# Patient Record
Sex: Female | Born: 1975 | Race: Black or African American | Hispanic: No | Marital: Married | Smoking: Never smoker
Health system: Southern US, Community
[De-identification: ages and names within clinical notes are randomized; demographics above are authoritative.]

---

## 2007-02-27 ENCOUNTER — Encounter: Admission: RE | Admit: 2007-02-27 | Discharge: 2007-02-28 | Payer: Self-pay | Admitting: Obstetrics and Gynecology

## 2007-03-01 ENCOUNTER — Inpatient Hospital Stay (HOSPITAL_COMMUNITY): Admission: AD | Admit: 2007-03-01 | Discharge: 2007-03-06 | Payer: Self-pay | Admitting: Obstetrics and Gynecology

## 2007-03-03 ENCOUNTER — Encounter (INDEPENDENT_AMBULATORY_CARE_PROVIDER_SITE_OTHER): Payer: Self-pay | Admitting: *Deleted

## 2008-04-04 ENCOUNTER — Ambulatory Visit (HOSPITAL_COMMUNITY): Admission: RE | Admit: 2008-04-04 | Discharge: 2008-04-04 | Payer: Self-pay | Admitting: Obstetrics

## 2008-04-04 IMAGING — US US OB COMP LESS 14 WK
1 series · 14 of 28 positions shown · non-contrast
Comparison: none

OBSTETRICAL ULTRASOUND:
 This ultrasound exam was performed in the [HOSPITAL] Ultrasound Department.  The OB US report was generated in the AS system, and faxed to the ordering physician.  This report is also available in [REDACTED] PACS.

[Series 1: us ob comp less 14 wks · 14 of 50 slices shown]
[im 2/50]
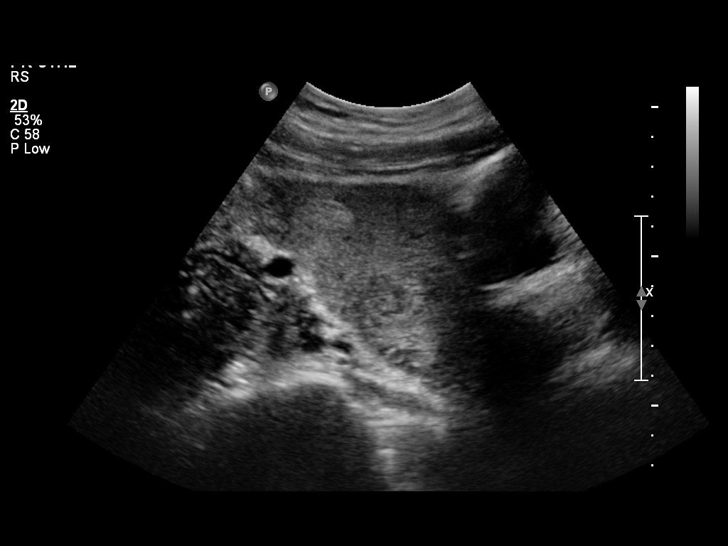
[im 6/50]
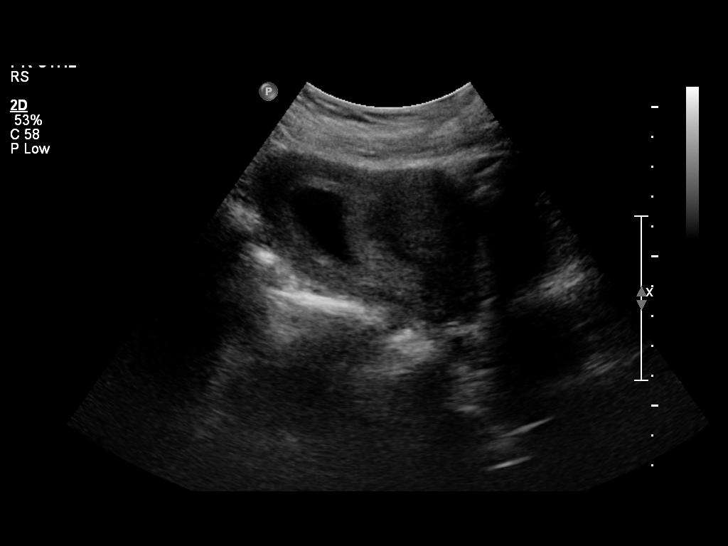
[im 10/50]
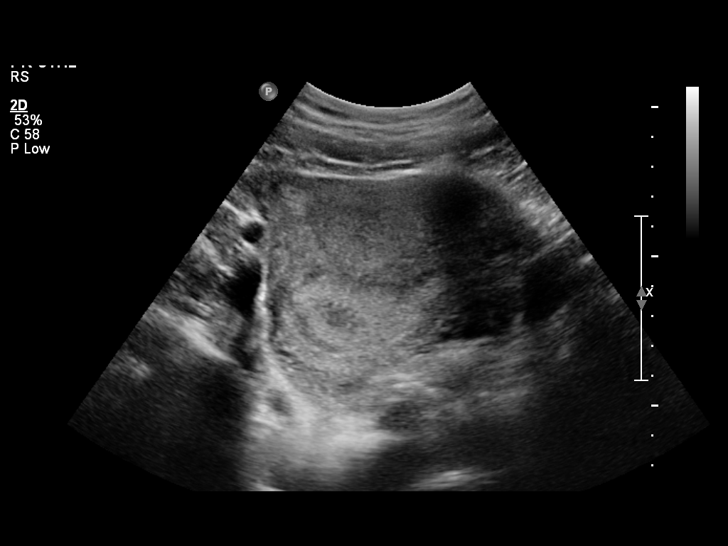
[im 13/50]
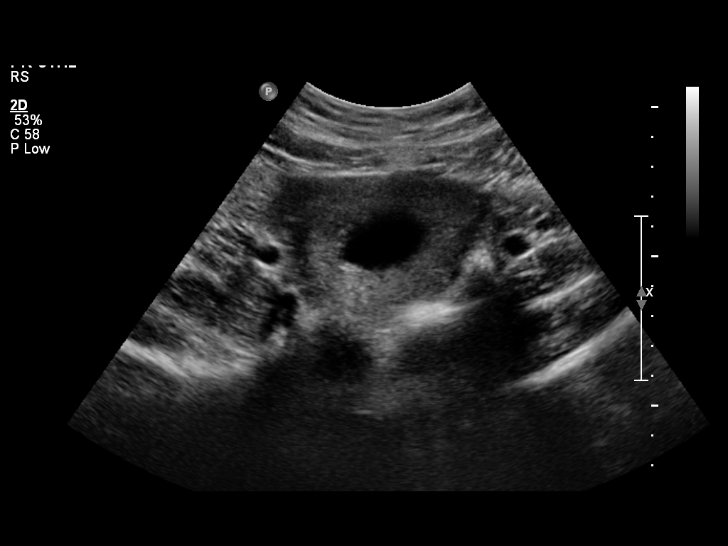
[im 17/50]
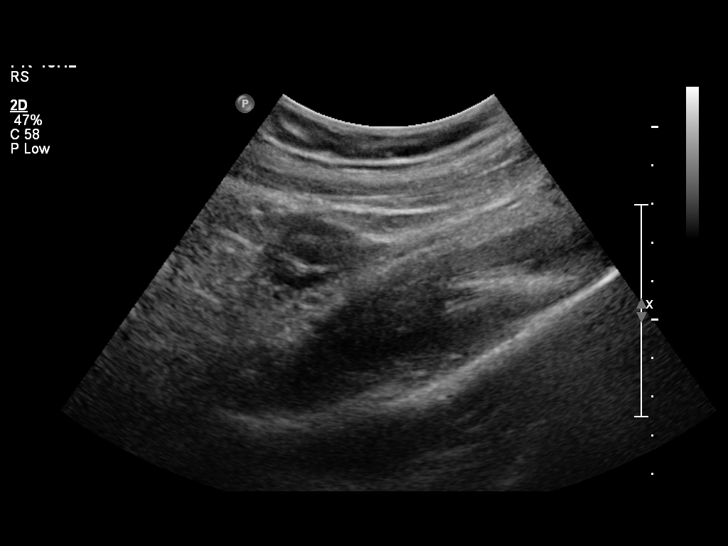
[im 20/50]
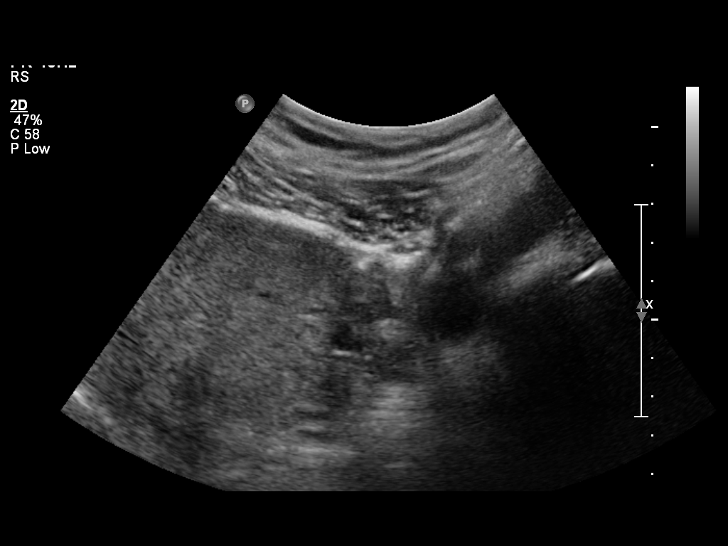
[im 24/50]
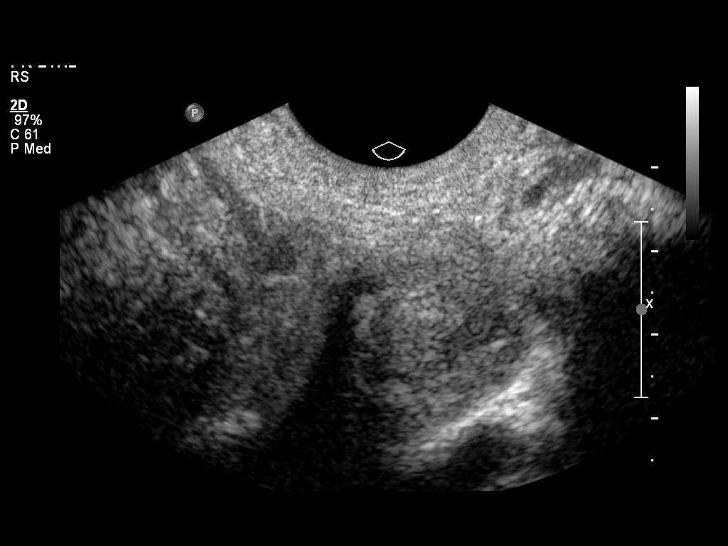
[im 28/50]
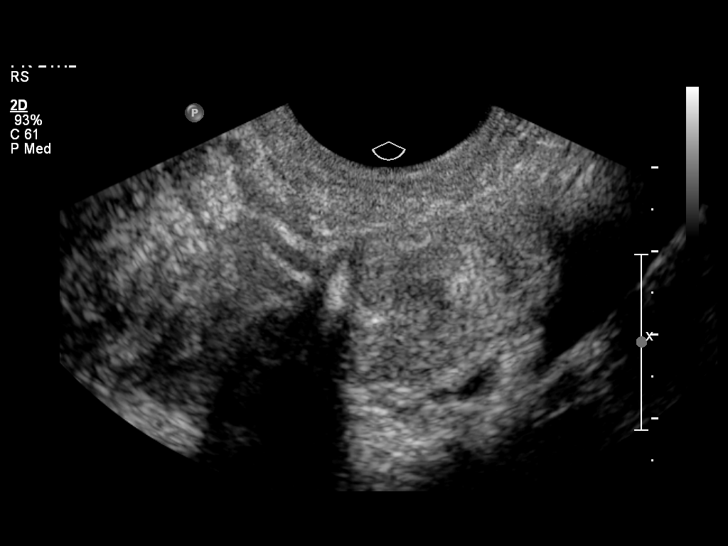
[im 31/50]
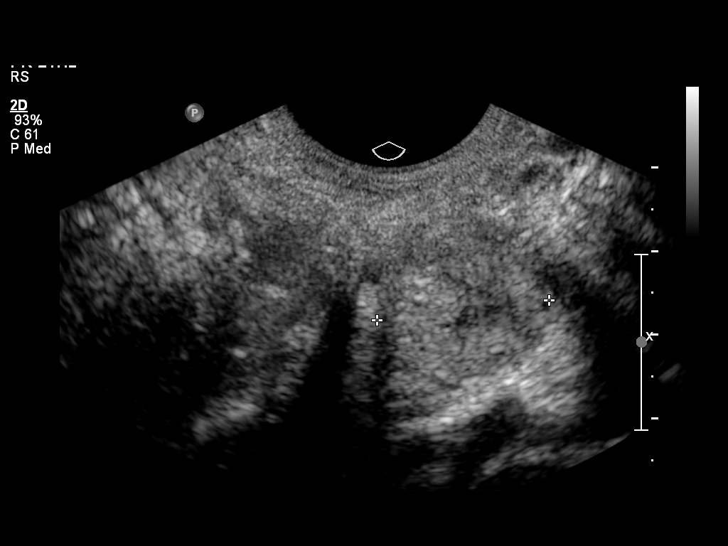
[im 35/50]
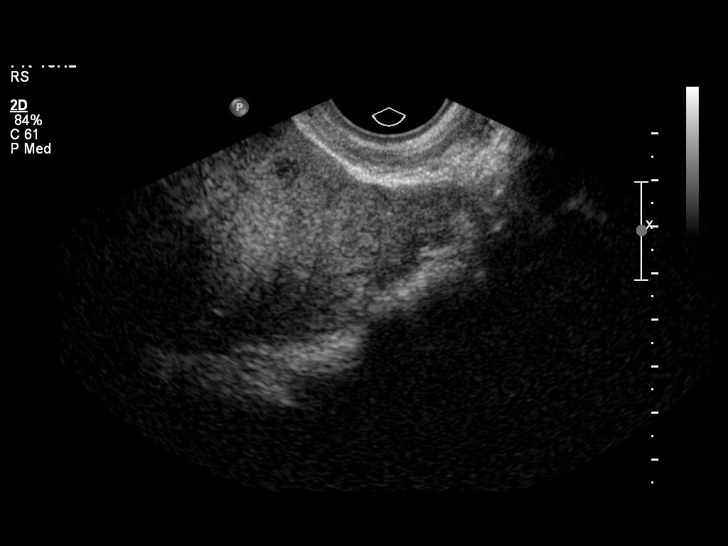
[im 39/50]
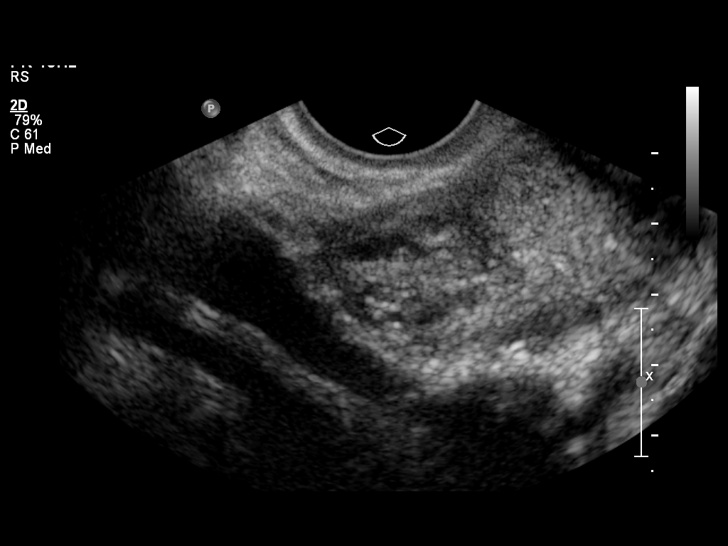
[im 42/50]
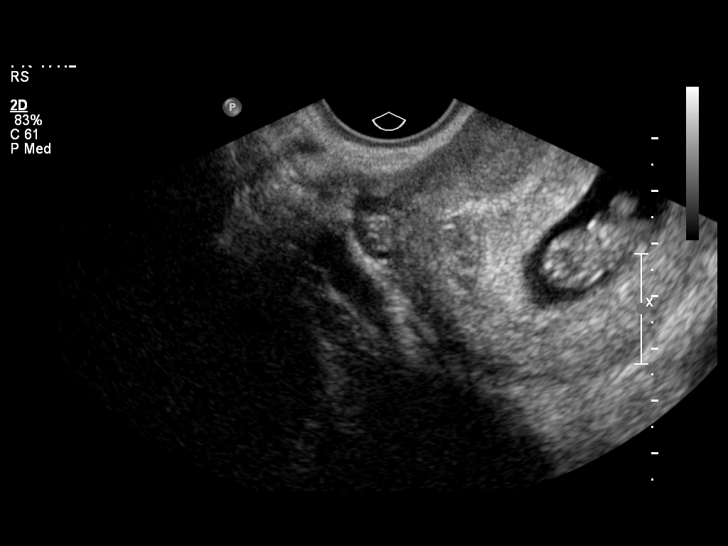
[im 46/50]
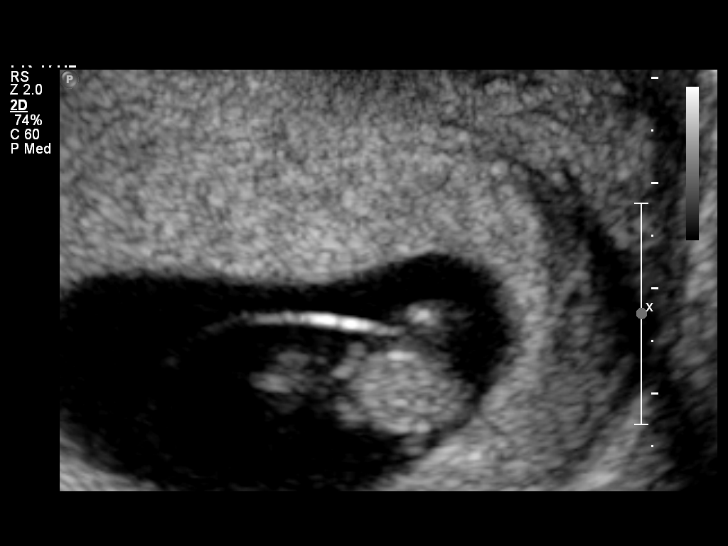
[im 50/50]
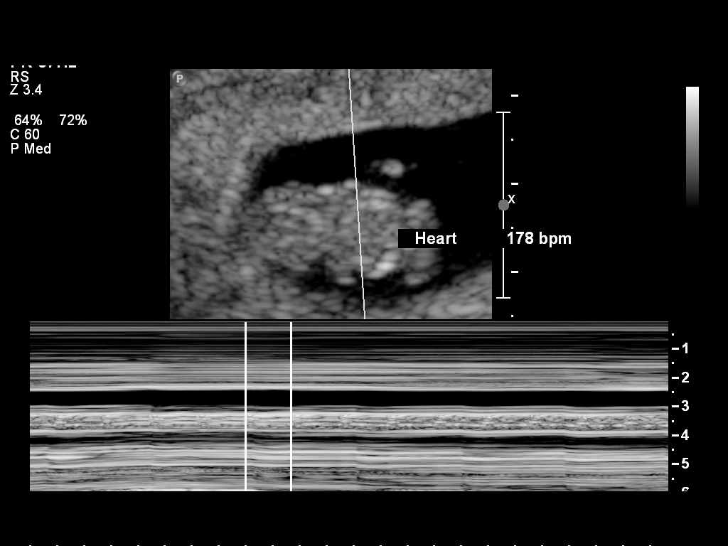

[14 of 28 positions shown; findings below may reference images not displayed]

IMPRESSION: See AS Obstetric US report.

## 2008-10-29 ENCOUNTER — Inpatient Hospital Stay (HOSPITAL_COMMUNITY): Admission: RE | Admit: 2008-10-29 | Discharge: 2008-11-01 | Payer: Self-pay | Admitting: Obstetrics

## 2011-05-04 NOTE — Op Note (Signed)
Gillespie, Cindy NO.:  [id number]   MEDICAL RECORD NO.:  [id number]          PATIENT TYPE:  INP   LOCATION:  9199                          FACILITY:  [facility]   PHYSICIAN:  Kathreen Cosier, M.D.DATE OF BIRTH:  [birth year]   DATE OF PROCEDURE:  10/29/2008  DATE OF DISCHARGE:                               OPERATIVE REPORT   PREOPERATIVE DIAGNOSIS:  Previous cesarean section at term.   POSTOPERATIVE DIAGNOSIS:  Previous cesarean section at term.   SURGEON:  Kathreen Cosier, MD   FIRST ASSISTANT:  Charles A. Clearance Coots, MD   PROCEDURE:  After the spinal administered, the patient in the supine  position, abdomen prepped and draped, bladder emptied with a Foley  catheter, transverse suprapubic incision made through the old scar,  carried down to the rectus fascia.  Fascia cleaned and incised to the  length of the incision.  Rectus muscles retracted laterally.  Peritoneum  was incised longitudinally.  Transverse incision made in the visceral  peritoneum above the bladder.  Bladder mobilized inferiorly.  Transverse  low uterine incision made.  Fluid clear.  The patient delivered from the  elevated position of a female, Apgars 9 and 9, weighing 7 pounds 10  ounces.  The team was in attendance.  The placenta was anterior fundal,  removed manually, and sent to labor and delivery.  Uterine cavity  cleaned with dry laps.  Uterine incision closed in 1 layer with  continuous suture of #1 chromic.  Hemostasis satisfactory.  Bladder flap  reattached with 2-0 chromic.  Uterus was well contracted.  Tubes and  ovaries normal.  Abdomen closed in layers; peritoneum, continuous suture  of 0-chromic; fascia, continuous suture of 0-Dexon.  Skin closed with  subcuticular suture of 4-0 Monocryl.  Blood loss 700 mL.           ______________________________  Kathreen Cosier, M.D.     BAM/MEDQ  D:  10/29/2008  T:  10/30/2008  Job:  [id number]

## 2011-05-07 NOTE — Discharge Summary (Signed)
[name], [name] NO.:  [id number]   MEDICAL RECORD NO.:  [id number]          PATIENT TYPE:  INP   LOCATION:  9127                          FACILITY:  [facility]   PHYSICIAN:  Janine Limbo, M.D.DATE OF BIRTH:  07-12-1976   DATE OF ADMISSION:  03/01/2007  DATE OF DISCHARGE:  03/06/2007                               DISCHARGE SUMMARY   ADMISSION DIAGNOSES:  1. Intrauterine pregnancy at 38-5/7 weeks.  2. Premature ruptured membranes at term without the spontaneous onset      of labor.   DISCHARGE DIAGNOSES:  1. Intrauterine pregnancy at 38-1/7 weeks.  2. Premature prolonged rupture of membranes prior to the onset of      labor.  3. Nonreassuring fetal heart rate.  4. Mild elevated blood pressure.   PROCEDURES:  1. Primary low transverse cesarean section.  2. Epidural anesthesia.   HOSPITAL COURSE:  [name] is a 35 year old gravida 1, para 0 who  presented at 37-5/7 weeks on the morning of March 01, 2007 with  spontaneous rupture of membranes since 11:30 the night before. She had  had no significant contractions at that time.   Her pregnancy had been remarkable for positive RPR with a confirmatory  TPAA.  The patient also was treated with penicillin for the syphilis.   HOSPITAL COURSE:  The patient on admission was closed, 50%, vertex at  minus three station and the patient's blood pressure was mildly elevated  at 140/90 with a trace of edema. PIH labs were done. Pitocin  augmentation was begun soon after that. By 7:45 the night cervix  fingertip, 80%, vertex still at -3. Through the night Pitocin was  continued.  The patient was only having mild contractions. By 4 o'clock  in afternoon of March 13, cervix was still essentially closed, 50%,  vertex -2 by Dr. Lilian Gillespie exam. Fetal heart rate reassuring.  The  patient was afebrile.  The patient did receive some pain medication for  painful contractions. The patient continued laboring along with  contractions every 2-3 minutes. Pitocin had been taken up to a max of 40  milliunits but then was decreased to 20 milliunits when there was some  hyperstimulation noted, although fetal heart rate remained reassuring.  Through the late evening and early morning the patient continued to  labor.  Artificial rupture membranes of forewaters was noted and there  was slight meconium-stained fluid noted. Cervix that time was 1, 80%  vertex -2.  Epidural was placed later that morning.  There were sporadic  late decelerations noted although variability was good.  Cervix by that  time 9:15 in the morning was 3-4, 80%, vertex at -2.  IUPC was placed  and scalp lead placed. Pitocin was discontinued for 10 minutes and heart  rate did improve. The patient was rested for 30 minutes and Pitocin was  restarted. By early that afternoon 4 or 4:30, the patient was evaluated  for recurrent late decelerations and decreased urinary output.  Pitocin  was on 18 milliunits per minute, [place]'s were inadequate. Fetal  heart rate was 110-120 with periods of marked  variability and repetitive  late decelerations. Cervix was 4 with positive edema of the anterior lip  and significant molding.  There was also OP presentation. Blood pressure  was approximately 140/90 at that time. Dr. Steva Gillespie discussed findings with  the patient including the sporadic prolonged decelerations that  persisted despite all usual measures.  She was then consented for  cesarean section.  She was taken to the operating room where a primary  low transverse cesarean section was performed by Dois Davenport A. Rivard, M.D.  Findings were a viable female by the name of [name] at 3:18 in the  afternoon of March 14.  Apgars were 08/09, weight was 7 pounds 2 ounces.  Cord pH was 7.25. The patient's blood pressure during her surgery is 140  to 160 over 70 to 90. Urine output was 200 mL during the C-section.  The  patient was watched very closely subsequent to that.   PIH labs were done  and showed a slight elevation of SGOT, SGPT on the morning of March 15,  up from baseline of 48 and 54. Hemoglobin was 12.2, hematocrit 35.5,  white blood cell count was 15.3, platelet count was 138.  Her physical  exam was within normal limits.  She was diuresing well with no  proteinuria noted on a voided specimen. She was deemed to have an  unexplained elevated LFT, but there was no evidence of water  intoxication.  The rest for hospital stay was essentially uncomplicated.  She began to diurese well and continued to feel very well.  Blood  pressure was within normal limits and she was tolerating a regular diet  and had good pain control with oral pain medication.  She was deemed to  received full benefit of hospital stay on March 06, 2007 and was  discharged home.   DISCHARGE CONDITION:  Stable.   DISCHARGE INSTRUCTIONS:  Per [name] handout.  She also was  given signs and symptoms of PIH and worsening edema to observe for.   DISCHARGE MEDICATIONS:  1. Motrin 600 mg p.o. q.6 h p.r.n. pain.  2. Percocet 5/325 one to two p.o. q.3-4 hours p.r.n. pain.  3. Prenatal vitamins one p.o. daily.   DISCHARGE FOLLOW-UP:  Will occur in 6 weeks at [name].      Renaldo Reel Emilee Hero, C.N.M.      Janine Limbo, M.D.  Electronically Signed    VLL/MEDQ  D:  03/06/2007  T:  03/06/2007  Job:  [id number]

## 2011-05-07 NOTE — H&P (Signed)
NAME:  [name], [name]                ACCOUNT NO.:  [id number]   MEDICAL RECORD NO.:  [id number]          PATIENT TYPE:  INP   LOCATION:  9172                          FACILITY:  [facility]   PHYSICIAN:  Naima A. Dillard, M.D. DATE OF BIRTH:  [birth year]   DATE OF ADMISSION:  03/01/2007  DATE OF DISCHARGE:                              HISTORY & PHYSICAL   HISTORY OF THE PRESENT ILLNESS:  This is a 35 year old gravida 1, para  0, 0, 0, 0 who is 37-5/7ths weeks pregnant and presents with complaints  of ruptured membranes since 11:30 last P.M.  She has had no painful  contractions, but a few painless contractions.  She has had no bleeding  and has had positive fetal movement.  She is leaking clear fluid.  This  pregnancy has been followed by Dr. Normand Sloop and is remarkable for  positive RPR, positive __________  with penicillin treatment.  Her group  B Strep is negative.   ALLERGIES:  None.   PAST OBSTETRICAL HISTORY:  The patient is a primigravida.   PAST MEDICAL HISTORY:  The patient's medical history if positive for:  1. Childhood varicella.  2. History of hepatitis vaccines.  3. Syphilis with the current pregnancy, which was treated.   PAST SURGICAL HISTORY:  The past surgical history is remarkable for  wisdom teeth extraction.   FAMILY HISTORY:  The family history is remarkable for an uncle with  heart disease, mother with hypertension and grandmother with varicose  veins.  Mother and grandmother with diabetes.  Grandmother with stroke.  A niece with depression.   GENETIC HISTORY:  The genetic history is remarkable for a nephew with a  heart murmur and cousins who are married.   SOCIAL HISTORY:  The patient is married to Rayfield Citizen who is involved  and is supportive.  She is of the [employer].  She denies any  alcohol, tobacco or drug use.   PRENATAL LABORATORY DATA:  Hemoglobin 12.8 and platelets 253,000.  Blood  type O positive, antibody screen negative.  Sickle cell  negative.  RPR  was reactive with __________ .  Rubella immune.  Hepatitis negative.  HIV negative.  Pap test normal.  Gonorrhea negative.  Chlamydia  negative.   HISTORY OF CURRENT PRESENT:  The patient entered care at [redacted] weeks  gestation.  First trimester screening was normal.  She was treated with  penicillin per protocol for positive syphilis testing.  Ultrasound at 12  weeks was normal.  Follow up AFP was normal.  She had an ultrasound was  anatomy, which was also normal.  Glucola was done at 27 weeks and was  elevated.  Three-hours GTT was normal, but the patient continued to have  sporadic elevations in her blood sugars and was placed on a diabetic  diet anyway.  She had an ultrasound at 36 weeks for size greater than  dates and the baby measured in the 81st percentile.  Group B Strep was  negative at term.   OBJECTIVE:  VITAL SIGNS: The vital signs are stable.  Afebrile.  Blood  pressure is slightly  elevated at 140/90.  There is trace edema.  HEENT:  The head, eyes, ears, nose and throat are within normal limits.  NECK:  The thyroid is normal and nontender enlarged.  CHEST:  The chest is clear to auscultation.  HEART:  The heart rate shows regular rate and rhythm.  ABDOMEN:  The abdomen is gravid at 38 cm, vertex by Leopold's.  Fetal  heart rate is 150.  Positive fetal movement.  VAGINAL EXAMINATION:  The patient's speculum exam shows gross pooling of  clear fluid.  The cervix is closed, 50%, -3 with vertex presentation.  EXTREMITIES:  The extremities are within normal limits, except for trace  edema.   ASSESSMENT:  1. Intrauterine pregnancy at 38-5/7ths week.  2. Premature rupture of membranes at term.  3. Not in labor.   PLAN:  1. Admit to birth suite per Dr. Normand Sloop.  2. Routine M.D. orders.  3. Pregnancy-induced hypertension laboratories.      Marie L. Williams, C.N.M.      Naima A. Normand Sloop, M.D.  Electronically Signed    MLW/MEDQ  D:  03/01/2007  T:   03/02/2007  Job:  [id number]

## 2011-05-07 NOTE — Discharge Summary (Signed)
NAME:  [name], [name] NO.:  [id number]   MEDICAL RECORD NO.:  [id number]          PATIENT TYPE:  INP   LOCATION:  9111                          FACILITY:  [facility]   PHYSICIAN:  Kathreen Cosier, M.D.DATE OF BIRTH:  [birth year]   DATE OF ADMISSION:  10/29/2008  DATE OF DISCHARGE:  11/01/2008                               DISCHARGE SUMMARY   The patient is a 35 year old gravida 3, para 1-0-1-1, had a previous C-  section and was now due on November 02, 2008, and had desired repeat low-  transverse cesarean section.  She underwent a repeat transverse cesarean  section, had a female, Apgar 9 and 9, weighing 7 pounds and 10 ounces from  the OP position.  Postoperatively, she did well.  Her hemoglobin was  12.2.  Sodium 140, potassium 4.1, and chloride 110.  RPR negative.  HIV  negative.  She was discharged on the third postoperative day having  tolerated her regular diet.  To see me in 6 weeks.   DISCHARGE DIAGNOSIS:  Status post repeat low-transverse cesarean section  at term.           ______________________________  Kathreen Cosier, M.D.     BAM/MEDQ  D:  12/04/2008  T:  12/05/2008  Job:  [id number]

## 2011-05-07 NOTE — Op Note (Signed)
NAME:  [name], FIERO NO.:  [id number]   MEDICAL RECORD NO.:  [id number]          PATIENT TYPE:  INP   LOCATION:  9127                          [facility]ACILITY:  [facility]   PHYSICIAN:  Crist Fat. Rivard, M.D. DATE OF BIRTH:  [birth year]   DATE OF PROCEDURE:  03/03/2007  DATE OF DISCHARGE:                               OPERATIVE REPORT   PREOPERATIVE DIAGNOSIS:  Intrauterine pregnancy at 38 weeks and 1 day  with prolonged rupture of membranes and nonreassuring fetal heart rate.   POSTOPERATIVE DIAGNOSIS:  Intrauterine pregnancy at 38 weeks and 1 day  with prolonged rupture of membranes and nonreassuring fetal heart rate.   PROCEDURE:  Primary low transverse Cesarean section, Dr. Estanislado Pandy.   ASSISTANT:  Renaldo Reel. Emilee Hero, certified nurse midwife.   ANESTHESIA:  Epidural, Dr. Harvest Forest.   ESTIMATED BLOOD LOSS:  500 mL.   PROCEDURE:  After being informed of the planned procedure with possible  complications including bleeding, infection, injury to bladder, bowels  or ureters, informed consent is obtained. The patient is taken to OR #1,  and preexisting epidural is reinforced along the way. She is prepped and  draped in a sterile fashion and placed in the dorsal decubitus position,  pelvic tilted to the left. A Foley catheter was already in her bladder.  After assessing adequate level of anesthesia, we proceed with  Pfannenstiel incision which is brought down to the fascia. The fascia is  incised in a low transverse fashion. Linea alba is dissected. Peritoneum  is entered bluntly and extended bluntly. Viscera peritoneum is entered  in a low transverse fashion, allowing [place] to safely retract bladder by  developing a bladder flap. Myometrium is entered in the low transverse  fashion, first with a knife, then extended bluntly. Amniotic fluid is  abundant and clear. We assist the birth of a female infant in OP  presentation at 3:18 p.m. Mouth and nose are suctioned. Baby is  delivered.  Cord is clamped with 2 Kelly clamps, and baby is given to the  pediatrician present in the room. An arterial cord pH is drawn, and 2 mL  of umbilical vein blood is also drawn. Placenta is delivered spontaneous  and rapidly and sent to pathology. Uterine revision is negative. We  proceed with closure of the myometrium in 2 layers, first with a running  locked suture of 0 Vicryl, then a Lembert suture of 0 Vicryl imbricating  the first one. Hemostasis is completed on the peritoneal edges and on  the dissection of the bladder with cauterization. Both pericolic gutters  are clean. Both tubes and ovaries assessed and normal. We profusely  irrigate the pelvis with warm saline, and hemostasis is deemed adequate.  On the fascia, hemostasis is completed with cautery, and the fascia is  closed with running sutures of 1 Vicryl meeting midline. The wound is  irrigated with warm saline, and the skin is closed with subcuticular  suture of 3-0 Monocryl and Steri-Strips.   Instrument and sponge count is complete x2. Estimated blood loss is 500  mL. The procedure is very well tolerated by  the patient who is taken to  recovery room in a well and stable condition. We do note some elevation  of her blood pressure during the procedure, 150s-160s/low 90s. Here in  PACU, her current blood pressure is 149/71. Her urine  output has been minimal during the latter phase of her labor. During the  procedure, she put out 200 mL of concentrated urine. Little boy who is  named [name] was born at 3:18 p.m., received an Apgar of 8 at one  minute and 9 at five minutes, weighs 7 pounds 2 ounces and had a cord pH  of 7.25.      Crist Fat Rivard, M.D.  Electronically Signed     SAR/MEDQ  D:  03/03/2007  T:  03/03/2007  Job:  [id number]

## 2011-09-21 LAB — CBC
HCT: 35 — ABNORMAL LOW
HCT: 37.6
MCV: 97.2
MCV: 99
Platelets: 126 — ABNORMAL LOW
RBC: 3.54 — ABNORMAL LOW
RBC: 3.86 — ABNORMAL LOW
WBC: 11.7 — ABNORMAL HIGH
WBC: 9.4

## 2014-10-30 ENCOUNTER — Ambulatory Visit (INDEPENDENT_AMBULATORY_CARE_PROVIDER_SITE_OTHER): Admitting: Obstetrics & Gynecology

## 2014-10-30 ENCOUNTER — Encounter: Payer: Self-pay | Admitting: Obstetrics & Gynecology

## 2014-10-30 VITALS — BP 108/71 | HR 57 | Temp 98.0°F | Ht 67.0 in | Wt 157.0 lb

## 2014-10-30 DIAGNOSIS — Z01419 Encounter for gynecological examination (general) (routine) without abnormal findings: Secondary | ICD-10-CM

## 2014-10-30 NOTE — Addendum Note (Signed)
Addended by: Henriette CombsHATTON, ANDREA L on: 10/30/2014 04:49 PM   Modules accepted: Orders

## 2014-10-30 NOTE — Patient Instructions (Signed)

## 2014-10-30 NOTE — Progress Notes (Signed)
Subjective:     Cindy ClarkStacy S Gillespie is a 38 y.o. female here for a routine exam.    Personal health questionnaire:  Is patient [name], have a family history of breast and/or ovarian cancer: no Is there a family history of uterine cancer diagnosed at age < 7750, gastrointestinal cancer, urinary tract cancer, family member who is a Personnel officerLynch syndrome-associated carrier: no Is the patient overweight and hypertensive, family history of diabetes, personal history of gestational diabetes or PCOS: no Is patient over 5155, have PCOS,  family history of premature CHD under age 565, diabetes, smoke, have hypertension or peripheral artery disease:  no At any time, has a partner hit, kicked or otherwise hurt or frightened you?: no Over the past 2 weeks, have you felt down, depressed or hopeless?: no Over the past 2 weeks, have you felt little interest or pleasure in doing things?:no   Gynecologic History Patient's last menstrual period was 10/19/2014. Contraception: IUD Last Pap: 2012. Results were: normal   Obstetric History OB History  No data available    History reviewed. No pertinent past medical history.  History reviewed. No pertinent past surgical history.  No current outpatient prescriptions on file. No Known Allergies  History  Substance Use Topics  . Smoking status: Never Smoker   . Smokeless tobacco: Not on file  . Alcohol Use: Yes     Comment: [place]     History reviewed. No pertinent family history.    Review of Systems  Constitutional: negative for fatigue and weight loss Respiratory: negative for cough and wheezing Cardiovascular: negative for chest pain, fatigue and palpitations Gastrointestinal: negative for abdominal pain and change in bowel habits Musculoskeletal:negative for myalgias Neurological: negative for gait problems and tremors Behavioral/Psych: negative for abusive relationship, depression Endocrine: negative for temperature intolerance    Genitourinary:negative for abnormal menstrual periods, genital lesions, hot flashes, sexual problems and vaginal discharge Integument/breast: negative for breast lump, breast tenderness, nipple discharge and skin lesion(s)    Objective:       BP 108/71 mmHg  Pulse 57  Temp(Src) 98 F (36.7 C)  Ht 5\' 7"  (1.702 m)  Wt 71.215 kg (157 lb)  BMI 24.58 kg/m2  LMP 10/19/2014 General:   alert  Skin:   no rash or abnormalities  Lungs:   clear to auscultation bilaterally  Heart:   regular rate and rhythm, S1, S2 normal, no murmur, click, rub or gallop  Breasts:   normal without suspicious masses, skin or nipple changes or axillary nodes  Abdomen:  normal findings: no organomegaly, soft, non-tender and no hernia  Pelvis:  External genitalia: normal general appearance Urinary system: urethral meatus normal and bladder without fullness, nontender Vaginal: normal without tenderness, induration or masses Cervix: normal appearance Adnexa: normal bimanual exam Uterus: anteverted and non-tender, normal size   Lab Review  Labs reviewed no Radiologic studies reviewed no    Assessment:    Healthy female exam.    Plan:    Education reviewed: calcium supplements, low fat, low cholesterol diet and weight bearing exercise.  Follow up as needed.

## 2014-11-01 LAB — PAP IG AND HPV HIGH-RISK: HPV DNA HIGH RISK: NOT DETECTED

## 2014-12-16 ENCOUNTER — Encounter: Payer: Self-pay | Admitting: *Deleted

## 2014-12-17 ENCOUNTER — Encounter: Payer: Self-pay | Admitting: Obstetrics & Gynecology
